# Patient Record
Sex: Male | Born: 1975
Health system: Southern US, Community
[De-identification: ages and names within clinical notes are randomized; demographics above are authoritative.]

---

## 2009-08-12 ENCOUNTER — Emergency Department (HOSPITAL_COMMUNITY): Admission: EM | Admit: 2009-08-12 | Discharge: 2009-08-12 | Payer: Self-pay | Admitting: Emergency Medicine

## 2018-11-20 ENCOUNTER — Other Ambulatory Visit: Payer: Self-pay

## 2018-11-20 ENCOUNTER — Emergency Department (HOSPITAL_BASED_OUTPATIENT_CLINIC_OR_DEPARTMENT_OTHER): Payer: BLUE CROSS/BLUE SHIELD

## 2018-11-20 ENCOUNTER — Encounter (HOSPITAL_BASED_OUTPATIENT_CLINIC_OR_DEPARTMENT_OTHER): Payer: Self-pay | Admitting: Emergency Medicine

## 2018-11-20 ENCOUNTER — Emergency Department (HOSPITAL_BASED_OUTPATIENT_CLINIC_OR_DEPARTMENT_OTHER)
Admission: EM | Admit: 2018-11-20 | Discharge: 2018-11-20 | Disposition: A | Discharge status: Home / Self Care | Payer: BLUE CROSS/BLUE SHIELD | Attending: Emergency Medicine | Admitting: Emergency Medicine

## 2018-11-20 DIAGNOSIS — R1013 Epigastric pain: Secondary | ICD-10-CM

## 2018-11-20 DIAGNOSIS — R1031 Right lower quadrant pain: Secondary | ICD-10-CM | POA: Insufficient documentation

## 2018-11-20 LAB — COMPREHENSIVE METABOLIC PANEL
ALT: 40 U/L (ref 0–44)
AST: 26 U/L (ref 15–41)
Albumin: 4.4 g/dL (ref 3.5–5.0)
Alkaline Phosphatase: 64 U/L (ref 38–126)
Anion gap: 7 (ref 5–15)
BUN: 13 mg/dL (ref 6–20)
CO2: 29 mmol/L (ref 22–32)
CREATININE: 0.94 mg/dL (ref 0.61–1.24)
Calcium: 9.4 mg/dL (ref 8.9–10.3)
Chloride: 101 mmol/L (ref 98–111)
GFR calc Af Amer: >60 mL/min (ref >60)
GFR calc non Af Amer: >60 mL/min (ref >60)
GLUCOSE: 163 mg/dL — AB (ref 70–99)
Potassium: 3.4 mmol/L — ABNORMAL LOW (ref 3.5–5.1)
Sodium: 137 mmol/L (ref 135–145)
Total Bilirubin: 0.7 mg/dL (ref 0.3–1.2)
Total Protein: 7.5 g/dL (ref 6.5–8.1)

## 2018-11-20 LAB — CBC WITH DIFFERENTIAL/PLATELET
ABS IMMATURE GRANULOCYTES: 0.03 10*3/uL (ref 0.00–0.07)
Basophils Absolute: 0.1 10*3/uL (ref 0.0–0.1)
Basophils Relative: 1 %
Eosinophils Absolute: 0.1 10*3/uL (ref 0.0–0.5)
Eosinophils Relative: 1 %
HCT: 45.9 % (ref 39.0–52.0)
HEMOGLOBIN: 14.6 g/dL (ref 13.0–17.0)
Immature Granulocytes: 0 %
LYMPHS ABS: 2.4 10*3/uL (ref 0.7–4.0)
Lymphocytes Relative: 25 %
MCH: 27.3 pg (ref 26.0–34.0)
MCHC: 31.8 g/dL (ref 30.0–36.0)
MCV: 85.8 fL (ref 80.0–100.0)
MONOS PCT: 7 %
Monocytes Absolute: 0.6 10*3/uL (ref 0.1–1.0)
Neutro Abs: 6.4 10*3/uL (ref 1.7–7.7)
Neutrophils Relative %: 66 %
Platelets: 181 10*3/uL (ref 150–400)
RBC: 5.35 MIL/uL (ref 4.22–5.81)
RDW: 12.8 % (ref 11.5–15.5)
WBC: 9.7 10*3/uL (ref 4.0–10.5)
nRBC: 0 % (ref 0.0–0.2)

## 2018-11-20 LAB — URINALYSIS, ROUTINE W REFLEX MICROSCOPIC
GLUCOSE, UA: NEGATIVE mg/dL
HGB URINE DIPSTICK: NEGATIVE
Ketones, ur: NEGATIVE mg/dL
Leukocytes,Ua: NEGATIVE
Nitrite: NEGATIVE
PH: 6 (ref 5.0–8.0)
Protein, ur: NEGATIVE mg/dL
Specific Gravity, Urine: >1.03 — ABNORMAL HIGH (ref 1.005–1.030)

## 2018-11-20 LAB — LIPASE, BLOOD: Lipase: 31 U/L (ref 11–51)

## 2018-11-20 MED ORDER — ALUM & MAG HYDROXIDE-SIMETH 200-200-20 MG/5ML PO SUSP
30.0000 mL | Freq: Once | ORAL | Status: AC
  Administered 2018-11-20: 30 mL via ORAL
  Filled 2018-11-20: qty 30

## 2018-11-20 MED ORDER — LIDOCAINE VISCOUS HCL 2 % MT SOLN
15.0000 mL | Freq: Once | OROMUCOSAL | Status: AC
  Administered 2018-11-20: 15 mL via ORAL
  Filled 2018-11-20: qty 15

## 2018-11-20 MED ORDER — SUCRALFATE 1 GM/10ML PO SUSP: 1.0000 g | Freq: Three times a day (TID) | ORAL | 0 refills | Status: AC

## 2018-11-20 MED ORDER — IOPAMIDOL (ISOVUE-300) INJECTION 61%
100.0000 mL | Freq: Once | INTRAVENOUS | Status: AC | PRN
  Administered 2018-11-20: 100 mL via INTRAVENOUS

## 2018-11-20 MED ORDER — OMEPRAZOLE 20 MG PO CPDR: 20.0000 mg | DELAYED_RELEASE_CAPSULE | Freq: Every day | ORAL | 0 refills | Status: AC

## 2018-11-20 NOTE — Discharge Instructions (Addendum)
Begin taking Prilosec once daily.  Take Carafate prior to eating and at bedtime.  Your CT did not show an obvious appendicitis today and our general surgeon on-call did not feel you need surgery today, however she advised that you return or go to the closest emergency department if you have worsening pain in your right lower quadrant or you develop fever over 100.4 F, intractable vomiting, or any other new or concerning symptoms.

## 2018-11-20 NOTE — ED Notes (Signed)
Patient transported to CT 

## 2018-11-20 NOTE — ED Provider Notes (Signed)
[facility] EMERGENCY DEPARTMENT Provider Note   CSN: [id number] Arrival date & time: 11/20/18  1723     History   Chief Complaint Chief Complaint  Patient presents with  . Abdominal Pain    HPI Louis Mcintyre is a 43 y.o. male.  Patient with no past surgical history presents to the emergency department abdominal pain x1 month.  At onset, patient had significant right lower quadrant pain.  He states that he went to an outside urgent care and was referred for imaging.  Patient was unable to follow-up for some reason.  Since that time his pain is moved from the right lower quadrant into the periumbilical to upper abdomen.  It is worse with eating food.  He denies any nausea, vomiting, or diarrhea.  No fevers, chest pain, or shortness of breath.  No cough.  No urinary symptoms.  No treatments prior to arrival.  He denies heavy NSAID use.  Denies alcohol use.  No history of gallbladder problems.       History reviewed. No pertinent past medical history.  There are no active problems to display for this patient.   History reviewed. No pertinent surgical history.      Home Medications    Prior to Admission medications   Not on File    Family History No family history on file.  Social History Social History   Tobacco Use  . Smoking status: Never Smoker  . Smokeless tobacco: Never Used  Substance Use Topics  . Alcohol use: Not Currently  . Drug use: Never     Allergies   Patient has no known allergies.   Review of Systems Review of Systems  Constitutional: Negative for fever.  HENT: Negative for rhinorrhea and sore throat.   Eyes: Negative for redness.  Respiratory: Negative for cough.   Cardiovascular: Negative for chest pain.  Gastrointestinal: Positive for abdominal pain. Negative for diarrhea, nausea and vomiting.  Genitourinary: Negative for dysuria.  Musculoskeletal: Negative for myalgias.  Skin: Negative for rash.  Neurological:  Negative for headaches.     Physical Exam Updated Vital Signs BP 125/83 (BP Location: Left Arm)   Pulse 82   Temp 98.3 F (36.8 C) (Oral)   Resp 16   Wt 91 kg   SpO2 99%   Physical Exam Vitals signs and nursing note reviewed.  Constitutional:      Appearance: He is well-developed.  HENT:     Head: Normocephalic and atraumatic.  Eyes:     General:        Right eye: No discharge.        Left eye: No discharge.     Conjunctiva/sclera: Conjunctivae normal.  Neck:     Musculoskeletal: Normal range of motion and neck supple.  Cardiovascular:     Rate and Rhythm: Normal rate and regular rhythm.     Heart sounds: Normal heart sounds.  Pulmonary:     Effort: Pulmonary effort is normal.     Breath sounds: Normal breath sounds.  Abdominal:     Palpations: Abdomen is soft.     Tenderness: There is abdominal tenderness (Mild) in the epigastric area and periumbilical area. There is no guarding or rebound. Negative signs include Murphy's sign, Rovsing's sign and McBurney's sign.  Skin:    General: Skin is warm and dry.  Neurological:     Mental Status: He is alert.      ED Treatments / Results  Labs (all labs ordered are listed, but only abnormal  results are displayed) Labs Reviewed  COMPREHENSIVE METABOLIC PANEL - Abnormal; Notable for the following components:      Result Value   Potassium 3.4 (*)    Glucose, Bld 163 (*)    All other components within normal limits  URINALYSIS, ROUTINE W REFLEX MICROSCOPIC - Abnormal; Notable for the following components:   Color, Urine AMBER (*)    Specific Gravity, Urine >1.030 (*)    Bilirubin Urine SMALL (*)    All other components within normal limits  CBC WITH DIFFERENTIAL/PLATELET  LIPASE, BLOOD    EKG None  Radiology No results found.  Procedures Procedures (including critical care time)  Medications Ordered in ED Medications - No data to display   Initial Impression / Assessment and Plan / ED Course  I have  reviewed the triage vital signs and the nursing notes.  Pertinent labs & imaging results that were available during my care of the patient were reviewed by me and considered in my medical decision making (see chart for details).     Patient seen and examined.  On initial exam, abdominal exam is not very impressive.  I cannot elicit any tenderness with palpation in the right lower quadrant.  There is mild periumbilical to epigastric pain.  Awaiting lab work.  Vital signs reviewed and are as follows: BP 125/83 (BP Location: Left Arm)   Pulse 82   Temp 98.3 F (36.8 C) (Oral)   Resp 16   Wt 91 kg   SpO2 99%   8:08 PM lab work is reassuring.  Discussed discharge with medical treatment and PCP follow-up versus additional imaging at this point.  Patient seems nervous about his pain and the fact that he was told to come to the emergency department previously for imaging.  He would prefer to have further imaging done tonight to rule out appendicitis or other significant abdominal etiology.  Handoff to Law PA-C at shift change.  If CT imaging is reassuring, patient may be discharged home with gastritis treatment.  Final Clinical Impressions(s) / ED Diagnoses   Final diagnoses:  None   Patient with ongoing abdominal pain, pending completion of work-up.  ED Discharge Orders    None       Renne Crigler, Cordelia Poche 11/20/18 2010    Pricilla Loveless, MD 11/22/18 [phone]

## 2018-11-20 NOTE — ED Provider Notes (Signed)
Signout at shift change from [facility], [place] See previous providers note for full H&P  Briefly, patient presents with abdominal pain for 3 weeks.  Went to urgent care couple weeks ago he had right lower quadrant pain and was told to go to the ED because he may have appendicitis.  He did not follow-up until today.  His right lower quadrant pain has improved, however now has epigastric and periumbilical pain.  Labs are reassuring.  CT abdomen pelvis is pending.  If negative CTAP, Prilosec, carafate.  CT abdomen pelvis shows the appendix is prominent in caliber, measuring 10 mm distally; there appears to be some mild periappendiceal stranding near the tip.  Findings are concerning for an early tip appendicitis; no other acute abnormalities.  I discussed patient case with general surgeon on-call, Dr. Maisie Fus, who did not feel strongly that patient's presentation was an appendicitis considering he has had 1 month of symptoms, no white blood cell count, and migrating abdominal pain.  Patient does have some right lower quadrant tenderness, however also has epigastric and periumbilical tenderness.  He reports he was not having much right lower quadrant tenderness anymore, but began that way 3 to 4 weeks ago.  Dr. Maisie Fus advised to discharge patient home, but to return to emergency department if he gets worse.  Patient given very strict return precautions including worsening right lower quadrant pain, persistent fever over 100.4, vomiting, or any other concerning symptoms.  Patient advised to go to [facility] or [facility] if he does return so that surgical services could be more readily available.  He will be given Prilosec and Carafate for possible gastritis symptoms as well.  Patient given follow-up to [facility] surgery.  He understands and agrees with plan.  Patient vital stable throughout ED course and discharged in satisfactory condition. I discussed patient case with Dr. Criss Alvine who  guided the patient's management and agrees with plan.    Emi Holes, PA-C 11/20/18 2329    Pricilla Loveless, MD 11/22/18 [phone]

## 2018-11-20 NOTE — ED Triage Notes (Signed)
Mid abd pain radiating to RLQ x 1 month.

## 2018-11-22 MED FILL — SUCRALFATE 1 GM TABLET: 1 | 10 days supply | Qty: 42 | Fill #0

## 2019-08-18 ENCOUNTER — Emergency Department (HOSPITAL_COMMUNITY)
Admission: EM | Admit: 2019-08-18 | Discharge: 2019-08-18 | Disposition: A | Discharge status: Home / Self Care | Payer: No Typology Code available for payment source | Attending: Emergency Medicine | Admitting: Emergency Medicine

## 2019-08-18 ENCOUNTER — Encounter (HOSPITAL_COMMUNITY): Payer: Self-pay

## 2019-08-18 ENCOUNTER — Emergency Department (HOSPITAL_COMMUNITY): Payer: No Typology Code available for payment source

## 2019-08-18 DIAGNOSIS — Z79899 Other long term (current) drug therapy: Secondary | ICD-10-CM | POA: Diagnosis not present

## 2019-08-18 DIAGNOSIS — S0990XA Unspecified injury of head, initial encounter: Secondary | ICD-10-CM | POA: Diagnosis present

## 2019-08-18 DIAGNOSIS — Y9389 Activity, other specified: Secondary | ICD-10-CM | POA: Insufficient documentation

## 2019-08-18 DIAGNOSIS — S3991XA Unspecified injury of abdomen, initial encounter: Secondary | ICD-10-CM | POA: Diagnosis not present

## 2019-08-18 DIAGNOSIS — T148XXA Other injury of unspecified body region, initial encounter: Secondary | ICD-10-CM

## 2019-08-18 DIAGNOSIS — M79652 Pain in left thigh: Secondary | ICD-10-CM | POA: Insufficient documentation

## 2019-08-18 DIAGNOSIS — S299XXA Unspecified injury of thorax, initial encounter: Secondary | ICD-10-CM | POA: Diagnosis not present

## 2019-08-18 DIAGNOSIS — S80212A Abrasion, left knee, initial encounter: Secondary | ICD-10-CM | POA: Diagnosis not present

## 2019-08-18 DIAGNOSIS — M898X5 Other specified disorders of bone, thigh: Secondary | ICD-10-CM

## 2019-08-18 DIAGNOSIS — Y92410 Unspecified street and highway as the place of occurrence of the external cause: Secondary | ICD-10-CM | POA: Diagnosis not present

## 2019-08-18 DIAGNOSIS — Y999 Unspecified external cause status: Secondary | ICD-10-CM | POA: Diagnosis not present

## 2019-08-18 DIAGNOSIS — Z23 Encounter for immunization: Secondary | ICD-10-CM | POA: Diagnosis not present

## 2019-08-18 LAB — I-STAT CHEM 8, ED
BUN: 14 mg/dL (ref 6–20)
Calcium, Ion: 1.19 mmol/L (ref 1.15–1.40)
Chloride: 103 mmol/L (ref 98–111)
Creatinine, Ser: 0.8 mg/dL (ref 0.61–1.24)
Glucose, Bld: 124 mg/dL — ABNORMAL HIGH (ref 70–99)
HCT: 44 % (ref 39.0–52.0)
Hemoglobin: 15 g/dL (ref 13.0–17.0)
Potassium: 3.5 mmol/L (ref 3.5–5.1)
Sodium: 140 mmol/L (ref 135–145)
TCO2: 25 mmol/L (ref 22–32)

## 2019-08-18 LAB — COMPREHENSIVE METABOLIC PANEL
ALT: 27 U/L (ref 0–44)
AST: 25 U/L (ref 15–41)
Albumin: 3.9 g/dL (ref 3.5–5.0)
Alkaline Phosphatase: 75 U/L (ref 38–126)
Anion gap: 11 (ref 5–15)
BUN: 13 mg/dL (ref 6–20)
CO2: 25 mmol/L (ref 22–32)
Calcium: 9.2 mg/dL (ref 8.9–10.3)
Chloride: 104 mmol/L (ref 98–111)
Creatinine, Ser: 0.9 mg/dL (ref 0.61–1.24)
GFR calc Af Amer: >60 mL/min (ref >60)
GFR calc non Af Amer: >60 mL/min (ref >60)
Glucose, Bld: 128 mg/dL — ABNORMAL HIGH (ref 70–99)
Potassium: 3.6 mmol/L (ref 3.5–5.1)
Sodium: 140 mmol/L (ref 135–145)
Total Bilirubin: 0.4 mg/dL (ref 0.3–1.2)
Total Protein: 6.7 g/dL (ref 6.5–8.1)

## 2019-08-18 LAB — CBC
HCT: 44.7 % (ref 39.0–52.0)
Hemoglobin: 14.8 g/dL (ref 13.0–17.0)
MCH: 28.4 pg (ref 26.0–34.0)
MCHC: 33.1 g/dL (ref 30.0–36.0)
MCV: 85.8 fL (ref 80.0–100.0)
Platelets: 188 10*3/uL (ref 150–400)
RBC: 5.21 MIL/uL (ref 4.22–5.81)
RDW: 12.6 % (ref 11.5–15.5)
WBC: 10.2 10*3/uL (ref 4.0–10.5)
nRBC: 0 % (ref 0.0–0.2)

## 2019-08-18 LAB — SAMPLE TO BLOOD BANK

## 2019-08-18 LAB — PROTIME-INR
INR: 1 (ref 0.8–1.2)
Prothrombin Time: 12.9 seconds (ref 11.4–15.2)

## 2019-08-18 LAB — ETHANOL: Alcohol, Ethyl (B): <10 mg/dL (ref <10)

## 2019-08-18 LAB — CDS SEROLOGY

## 2019-08-18 LAB — LACTIC ACID, PLASMA: Lactic Acid, Venous: 1.7 mmol/L (ref 0.5–1.9)

## 2019-08-18 MED ORDER — TETANUS-DIPHTH-ACELL PERTUSSIS 5-2.5-18.5 LF-MCG/0.5 IM SUSP
0.5000 mL | Freq: Once | INTRAMUSCULAR | Status: AC
  Administered 2019-08-18: 0.5 mL via INTRAMUSCULAR
  Filled 2019-08-18: qty 0.5

## 2019-08-18 MED ORDER — NAPROXEN 500 MG PO TABS: 500.0000 mg | ORAL_TABLET | Freq: Two times a day (BID) | ORAL | 0 refills | Status: AC

## 2019-08-18 MED ORDER — IOHEXOL 300 MG/ML  SOLN
100.0000 mL | Freq: Once | INTRAMUSCULAR | Status: AC | PRN
  Administered 2019-08-18: 100 mL via INTRAVENOUS

## 2019-08-18 MED ORDER — METHOCARBAMOL 500 MG PO TABS: 500.0000 mg | ORAL_TABLET | Freq: Two times a day (BID) | ORAL | 0 refills | Status: AC

## 2019-08-18 MED ORDER — MORPHINE SULFATE (PF) 4 MG/ML IV SOLN
4.0000 mg | Freq: Once | INTRAVENOUS | Status: AC
  Administered 2019-08-18: 4 mg via INTRAVENOUS
  Filled 2019-08-18: qty 1
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
## 2019-08-18 NOTE — ED Notes (Signed)
Discharge instructions and prescriptions discussed with Pt. Pt verbalized understanding. Pt stable and ambulatory.   
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
## 2021-04-10 ENCOUNTER — Other Ambulatory Visit: Payer: Self-pay

## 2021-04-10 ENCOUNTER — Ambulatory Visit (HOSPITAL_COMMUNITY)
Admission: EM | Admit: 2021-04-10 | Discharge: 2021-04-10 | Disposition: A | Discharge status: Home / Self Care | Payer: 59 | Attending: Internal Medicine | Admitting: Internal Medicine

## 2021-04-10 DIAGNOSIS — Z20822 Contact with and (suspected) exposure to covid-19: Secondary | ICD-10-CM | POA: Insufficient documentation

## 2021-04-10 NOTE — ED Triage Notes (Signed)
PT needs COVID PCR for travel.

## 2021-04-11 LAB — SARS CORONAVIRUS 2 (TAT 6-24 HRS): SARS Coronavirus 2: NEGATIVE
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
